# Patient Record
Sex: Male | Born: 1980
Health system: Southern US, Community
[De-identification: ages and names within clinical notes are randomized; demographics above are authoritative.]

---

## 2017-03-10 ENCOUNTER — Encounter (HOSPITAL_COMMUNITY): Payer: Self-pay

## 2017-03-10 ENCOUNTER — Other Ambulatory Visit: Payer: Self-pay

## 2017-03-10 ENCOUNTER — Emergency Department (HOSPITAL_COMMUNITY)
Admission: EM | Admit: 2017-03-10 | Discharge: 2017-03-10 | Disposition: A | Discharge status: Home / Self Care | Payer: No Typology Code available for payment source | Attending: Emergency Medicine | Admitting: Emergency Medicine

## 2017-03-10 ENCOUNTER — Emergency Department (HOSPITAL_COMMUNITY): Payer: No Typology Code available for payment source

## 2017-03-10 DIAGNOSIS — Y999 Unspecified external cause status: Secondary | ICD-10-CM | POA: Insufficient documentation

## 2017-03-10 DIAGNOSIS — Y929 Unspecified place or not applicable: Secondary | ICD-10-CM | POA: Insufficient documentation

## 2017-03-10 DIAGNOSIS — S63601A Unspecified sprain of right thumb, initial encounter: Secondary | ICD-10-CM

## 2017-03-10 DIAGNOSIS — S43401A Unspecified sprain of right shoulder joint, initial encounter: Secondary | ICD-10-CM | POA: Diagnosis not present

## 2017-03-10 DIAGNOSIS — S4991XA Unspecified injury of right shoulder and upper arm, initial encounter: Secondary | ICD-10-CM | POA: Diagnosis present

## 2017-03-10 DIAGNOSIS — Y9389 Activity, other specified: Secondary | ICD-10-CM | POA: Insufficient documentation

## 2017-03-10 MED ORDER — METHOCARBAMOL 500 MG PO TABS: 500.0000 mg | ORAL_TABLET | Freq: Two times a day (BID) | ORAL | 0 refills | Status: DC

## 2017-03-10 MED ORDER — DICLOFENAC SODIUM 75 MG PO TBEC
75.0000 mg | DELAYED_RELEASE_TABLET | Freq: Once | ORAL | Status: AC
  Administered 2017-03-10: 75 mg via ORAL
  Filled 2017-03-10 (×2): qty 1

## 2017-03-10 MED ORDER — DICLOFENAC SODIUM 75 MG PO TBEC: 75.0000 mg | DELAYED_RELEASE_TABLET | Freq: Two times a day (BID) | ORAL | 0 refills | Status: DC
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
## 2017-03-10 NOTE — ED Notes (Signed)
Messaged pharmacy to verify medication. 

## 2017-03-10 NOTE — Discharge Instructions (Signed)
Return if any problems.
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
## 2018-09-30 ENCOUNTER — Other Ambulatory Visit: Payer: Self-pay

## 2018-09-30 ENCOUNTER — Emergency Department (HOSPITAL_BASED_OUTPATIENT_CLINIC_OR_DEPARTMENT_OTHER): Payer: No Typology Code available for payment source

## 2018-09-30 ENCOUNTER — Encounter (HOSPITAL_BASED_OUTPATIENT_CLINIC_OR_DEPARTMENT_OTHER): Payer: Self-pay

## 2018-09-30 ENCOUNTER — Emergency Department (HOSPITAL_BASED_OUTPATIENT_CLINIC_OR_DEPARTMENT_OTHER)
Admission: EM | Admit: 2018-09-30 | Discharge: 2018-09-30 | Disposition: A | Discharge status: Home / Self Care | Payer: No Typology Code available for payment source | Attending: Emergency Medicine | Admitting: Emergency Medicine

## 2018-09-30 DIAGNOSIS — Y939 Activity, unspecified: Secondary | ICD-10-CM | POA: Insufficient documentation

## 2018-09-30 DIAGNOSIS — Y999 Unspecified external cause status: Secondary | ICD-10-CM | POA: Insufficient documentation

## 2018-09-30 DIAGNOSIS — S161XXA Strain of muscle, fascia and tendon at neck level, initial encounter: Secondary | ICD-10-CM | POA: Insufficient documentation

## 2018-09-30 DIAGNOSIS — S199XXA Unspecified injury of neck, initial encounter: Secondary | ICD-10-CM | POA: Diagnosis present

## 2018-09-30 DIAGNOSIS — Z79899 Other long term (current) drug therapy: Secondary | ICD-10-CM | POA: Diagnosis not present

## 2018-09-30 DIAGNOSIS — R51 Headache: Secondary | ICD-10-CM | POA: Diagnosis not present

## 2018-09-30 DIAGNOSIS — F17210 Nicotine dependence, cigarettes, uncomplicated: Secondary | ICD-10-CM | POA: Diagnosis not present

## 2018-09-30 DIAGNOSIS — S39012A Strain of muscle, fascia and tendon of lower back, initial encounter: Secondary | ICD-10-CM | POA: Insufficient documentation

## 2018-09-30 DIAGNOSIS — Y92410 Unspecified street and highway as the place of occurrence of the external cause: Secondary | ICD-10-CM | POA: Diagnosis not present

## 2018-09-30 MED ORDER — CYCLOBENZAPRINE HCL 10 MG PO TABS: 10.0000 mg | ORAL_TABLET | Freq: Two times a day (BID) | ORAL | 0 refills | Status: DC | PRN

## 2018-09-30 MED ORDER — IBUPROFEN 800 MG PO TABS: 800.0000 mg | ORAL_TABLET | Freq: Three times a day (TID) | ORAL | 0 refills | Status: DC | PRN
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
## 2018-09-30 NOTE — ED Notes (Signed)
Patient transported to CT 

## 2020-02-28 ENCOUNTER — Encounter (HOSPITAL_BASED_OUTPATIENT_CLINIC_OR_DEPARTMENT_OTHER): Payer: Self-pay | Admitting: *Deleted

## 2020-02-28 ENCOUNTER — Emergency Department (HOSPITAL_BASED_OUTPATIENT_CLINIC_OR_DEPARTMENT_OTHER)
Admission: EM | Admit: 2020-02-28 | Discharge: 2020-02-28 | Disposition: A | Discharge status: Home / Self Care | Payer: Self-pay | Attending: Emergency Medicine | Admitting: Emergency Medicine

## 2020-02-28 ENCOUNTER — Other Ambulatory Visit: Payer: Self-pay

## 2020-02-28 ENCOUNTER — Other Ambulatory Visit (HOSPITAL_BASED_OUTPATIENT_CLINIC_OR_DEPARTMENT_OTHER): Payer: Self-pay | Admitting: Emergency Medicine

## 2020-02-28 DIAGNOSIS — M5441 Lumbago with sciatica, right side: Secondary | ICD-10-CM

## 2020-02-28 DIAGNOSIS — M545 Low back pain, unspecified: Secondary | ICD-10-CM | POA: Insufficient documentation

## 2020-02-28 DIAGNOSIS — F17200 Nicotine dependence, unspecified, uncomplicated: Secondary | ICD-10-CM | POA: Insufficient documentation

## 2020-02-28 MED ORDER — KETOROLAC TROMETHAMINE 60 MG/2ML IM SOLN
30.0000 mg | Freq: Once | INTRAMUSCULAR | Status: AC
  Administered 2020-02-28: 30 mg via INTRAMUSCULAR
  Filled 2020-02-28: qty 2

## 2020-02-28 MED ORDER — LIDOCAINE 5 % EX PTCH: 1.0000 | MEDICATED_PATCH | CUTANEOUS | 0 refills | Status: DC

## 2020-02-28 MED ORDER — HYDROCODONE-ACETAMINOPHEN 5-325 MG PO TABS: 1.0000 | ORAL_TABLET | Freq: Four times a day (QID) | ORAL | 0 refills | Status: DC | PRN

## 2020-02-28 MED ORDER — NAPROXEN 500 MG PO TABS: 500.0000 mg | ORAL_TABLET | Freq: Two times a day (BID) | ORAL | 0 refills | Status: DC

## 2020-02-28 MED ORDER — METHOCARBAMOL 750 MG PO TABS: 750.0000 mg | ORAL_TABLET | Freq: Two times a day (BID) | ORAL | 0 refills | Status: DC | PRN

## 2020-02-28 MED ORDER — PREDNISONE 10 MG (21) PO TBPK
ORAL_TABLET | ORAL | 0 refills | Status: AC
Start: 2020-02-28 — End: ?

## 2020-02-28 MED FILL — LIDOCAINE PATCH 5%: 5 | 30 days supply | Qty: 30 | Fill #0

## 2020-02-28 MED FILL — HYDROCODON-APAP 5-325: 5-325 | 2 days supply | Qty: 6 | Fill #0

## 2020-02-28 MED FILL — predniSONE 10 MG TABS: 10 | 6 days supply | Qty: 21 | Fill #0

## 2020-02-28 MED FILL — NAPROXEN 500 MG TABS: 500 | 15 days supply | Qty: 30 | Fill #0

## 2020-02-28 MED FILL — METHOCARBAMOL 750 MG TABS: 750 | 10 days supply | Qty: 20 | Fill #0

## 2020-02-28 NOTE — ED Triage Notes (Signed)
Walking down steps and felt pain in his lower back with radiation down his right leg. He is ambulatory.

## 2020-02-28 NOTE — Discharge Instructions (Signed)
  Take it easy, but do not lay around too much as this may make any stiffness worse.  Antiinflammatory medications: Take 600 mg of ibuprofen every 6 hours or 440 mg (over the counter dose) to 500 mg (prescription dose) of naproxen every 12 hours for the next 3 days. After this time, these medications may be used as needed for pain. Take these medications with food to avoid upset stomach. Choose only one of these medications, do not take them together. Acetaminophen (generic for Tylenol): Should you continue to have additional pain while taking the ibuprofen or naproxen, you may add in acetaminophen as needed. Your daily total maximum amount of acetaminophen from all sources should be limited to 4000mg /day for persons without liver problems, or 2000mg /day for those with liver problems. Vicodin: May take Vicodin (hydrocodone-acetaminophen) as needed for severe pain.   Do not drive or perform other dangerous activities while taking this medication as it can cause drowsiness as well as changes in reaction time and judgement.   Please note that each pill of Vicodin contains 325 mg of acetaminophen (generic for Tylenol) and the above dosage limits apply. Methocarbamol: Methocarbamol (generic for Robaxin) is a muscle relaxer and can help relieve stiff muscles or muscle spasms.  Do not drive or perform other dangerous activities while taking this medication as it can cause drowsiness as well as changes in reaction time and judgement. Lidocaine patches: These are available via either prescription or over-the-counter. The over-the-counter option may be more economical one and are likely just as effective. There are multiple over-the-counter brands, such as Salonpas. Ice: May apply ice to the area over the next 24 hours for 15 minutes at a time to reduce pain, inflammation, and swelling, if present. Exercises: Be sure to perform the attached exercises starting with three times a week and working up to performing them  daily. This is an essential part of preventing long term problems.  Follow up: Follow up with the orthopedic specialist for any future management of these complaints. Be sure to follow up within 7-10 days. Return: Return to the ED should symptoms worsen.  For prescription assistance, may try using prescription discount sites or apps, such as goodrx.com

## 2020-02-28 NOTE — ED Provider Notes (Signed)
[facility] EMERGENCY DEPARTMENT Provider Note   CSN: [id number] Arrival date & time: 02/28/20  1403     History Chief Complaint  Patient presents with  . Back Pain    Eduardo Thomas is a 39 y.o. male.  HPI      Eduardo Thomas is a 39 y.o. male, presenting to the ED with acute on chronic lower back pain beginning two days ago.   Pain is located on the right lower back, described as a tightness and soreness, intermittent spasms, moderate to severe, radiating into the buttocks and down the back of the right leg.  Intermittent tingling in the right leg as well.  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  He has been taking ibuprofen on a schedule without improvement.  Denies falls/trauma, fever, abdominal pain, numbness, weakness, changes in bowel or bladder function, saddle anesthesias, urinary symptoms, or any other complaints.     History reviewed. No pertinent past medical history.  There are no problems to display for this patient.   History reviewed. No pertinent surgical history.     No family history on file.  Social History   Tobacco Use  . Smoking status: Current Every Day Smoker  . Smokeless tobacco: Never Used  Vaping Use  . Vaping Use: Never used  Substance Use Topics  . Alcohol use: Never  . Drug use: Yes    Types: Marijuana    Home Medications Prior to Admission medications   Medication Sig Start Date End Date Taking? Authorizing Provider  HYDROcodone-acetaminophen (NORCO/VICODIN) 5-325 MG tablet Take 1-2 tablets by mouth every 6 (six) hours as needed for severe pain. 02/28/20   Nimra Puccinelli C, PA-C  lidocaine (LIDODERM) 5 % Place 1 patch onto the skin daily. Remove & Discard patch within 12 hours or as directed by MD 02/28/20   Saahas Hidrogo, Hillard Danker, PA-C  methocarbamol (ROBAXIN) 750 MG tablet Take 1 tablet (750 mg total) by mouth 2 (two) times daily as needed for muscle spasms  (or muscle tightness). 02/28/20   Rodricus Candelaria C, PA-C  naproxen (NAPROSYN) 500 MG tablet Take 1 tablet (500 mg total) by mouth 2 (two) times daily. 02/28/20   Rayanne Padmanabhan C, PA-C  predniSONE (STERAPRED UNI-PAK 21 TAB) 10 MG (21) TBPK tablet Take 6 tabs (60mg ) day 1, 5 tabs (50mg ) day 2, 4 tabs (40mg ) day 3, 3 tabs (30mg ) day 4, 2 tabs (20mg ) day 5, and 1 tab (10mg ) day 6. 02/28/20   Lela Gell C, PA-C    Allergies    Patient has no known allergies.  Review of Systems   Review of Systems  Constitutional: Negative for chills and fever.  Respiratory: Negative for shortness of breath.   Cardiovascular: Negative for chest pain.  Gastrointestinal: Negative for abdominal pain, nausea and vomiting.  Genitourinary: Negative for difficulty urinating, dysuria, scrotal swelling and testicular pain.  Musculoskeletal: Positive for back pain. Negative for neck pain.  Neurological: Negative for weakness and numbness.  All other systems reviewed and are negative.   Physical Exam Updated Vital Signs BP 113/78   Pulse 71   Temp 98.5 F (36.9 C) (Oral)   Resp 16   Ht 6' (1.829 m)   Wt 88.5 kg   SpO2 100%   BMI 26.45 kg/m   Physical Exam Vitals and nursing note reviewed.  Constitutional:      General: He is not in acute distress.  Appearance: He is well-developed. He is not diaphoretic.  HENT:     Head: Normocephalic and atraumatic.     Mouth/Throat:     Mouth: Mucous membranes are moist.     Pharynx: Oropharynx is clear.  Eyes:     Conjunctiva/sclera: Conjunctivae normal.  Cardiovascular:     Rate and Rhythm: Normal rate and regular rhythm.     Pulses: Normal pulses.          Radial pulses are 2+ on the right side and 2+ on the left side.       Posterior tibial pulses are 2+ on the right side and 2+ on the left side.     Comments: Tactile temperature in the extremities appropriate and equal bilaterally. Pulmonary:     Effort: Pulmonary effort is normal. No respiratory distress.    Abdominal:     Palpations: Abdomen is soft.     Tenderness: There is no abdominal tenderness. There is no guarding.  Musculoskeletal:     Cervical back: Neck supple.     Lumbar back: Tenderness present. Positive right straight leg raise test.       Back:     Right lower leg: No edema.     Left lower leg: No edema.  Skin:    General: Skin is warm and dry.  Neurological:     Mental Status: He is alert.     Deep Tendon Reflexes:     Reflex Scores:      Patellar reflexes are 2+ on the right side and 2+ on the left side.    Comments: Sensation grossly intact to light touch in the lower extremities bilaterally. No saddle anesthesias. Strength 5/5 in the bilateral lower extremities. Slow, antalgic gait.  Ambulates independently without noted instability.  No noted foot drop. Coordination intact.  Psychiatric:        Mood and Affect: Mood and affect normal.        Speech: Speech normal.        Behavior: Behavior normal.     ED Results / Procedures / Treatments   Labs (all labs ordered are listed, but only abnormal results are displayed) Labs Reviewed - No data to display  EKG None  Radiology No results found.  Procedures Procedures (including critical care time)  Medications Ordered in ED Medications  ketorolac (TORADOL) injection 30 mg (30 mg Intramuscular Given 02/28/20 1508)    ED Course  I have reviewed the triage vital signs and the nursing notes.  Pertinent labs & imaging results that were available during my care of the patient were reviewed by me and considered in my medical decision making (see chart for details).    MDM Rules/Calculators/A&P                          Patient presents with right lower back pain with symptoms suggestive of sciatica. No findings which would give concern for neurosurgical emergency, such as cauda equina. The patient was given instructions for home care as well as return precautions. Patient voices understanding of these  instructions, accepts the plan, and is comfortable with discharge.   Final Clinical Impression(s) / ED Diagnoses Final diagnoses:  Acute right-sided low back pain with right-sided sciatica    Rx / DC Orders ED Discharge Orders         Ordered    predniSONE (STERAPRED UNI-PAK 21 TAB) 10 MG (21) TBPK tablet        02/28/20  1501    naproxen (NAPROSYN) 500 MG tablet  2 times daily        02/28/20 1501    methocarbamol (ROBAXIN) 750 MG tablet  2 times daily PRN        02/28/20 1501    HYDROcodone-acetaminophen (NORCO/VICODIN) 5-325 MG tablet  Every 6 hours PRN        02/28/20 1501    lidocaine (LIDODERM) 5 %  Every 24 hours        02/28/20 1501           Anselm Pancoast, PA-C 02/28/20 1513    Benjiman Core, MD 02/29/20 [phone]

## 2020-02-28 NOTE — ED Notes (Signed)
Review D/C papers with pt, reviewed Rx with pt, pt states understanding, pt denies questions at this time. 

## 2020-03-12 ENCOUNTER — Telehealth: Payer: Self-pay | Admitting: Family Medicine

## 2020-03-12 NOTE — Telephone Encounter (Signed)
Called pt to offer ED f/u appt --no answer , message left.  --glh

## 2020-03-15 ENCOUNTER — Emergency Department (HOSPITAL_BASED_OUTPATIENT_CLINIC_OR_DEPARTMENT_OTHER)
Admission: EM | Admit: 2020-03-15 | Discharge: 2020-03-16 | Disposition: A | Discharge status: Home / Self Care | Payer: Self-pay | Attending: Emergency Medicine | Admitting: Emergency Medicine

## 2020-03-15 ENCOUNTER — Emergency Department (HOSPITAL_BASED_OUTPATIENT_CLINIC_OR_DEPARTMENT_OTHER): Payer: Self-pay

## 2020-03-15 ENCOUNTER — Encounter (HOSPITAL_BASED_OUTPATIENT_CLINIC_OR_DEPARTMENT_OTHER): Payer: Self-pay

## 2020-03-15 ENCOUNTER — Other Ambulatory Visit: Payer: Self-pay

## 2020-03-15 DIAGNOSIS — R5383 Other fatigue: Secondary | ICD-10-CM | POA: Insufficient documentation

## 2020-03-15 DIAGNOSIS — R197 Diarrhea, unspecified: Secondary | ICD-10-CM | POA: Insufficient documentation

## 2020-03-15 DIAGNOSIS — R52 Pain, unspecified: Secondary | ICD-10-CM | POA: Insufficient documentation

## 2020-03-15 DIAGNOSIS — F17210 Nicotine dependence, cigarettes, uncomplicated: Secondary | ICD-10-CM | POA: Insufficient documentation

## 2020-03-15 DIAGNOSIS — J189 Pneumonia, unspecified organism: Secondary | ICD-10-CM

## 2020-03-15 DIAGNOSIS — J181 Lobar pneumonia, unspecified organism: Secondary | ICD-10-CM | POA: Insufficient documentation

## 2020-03-15 DIAGNOSIS — Z20822 Contact with and (suspected) exposure to covid-19: Secondary | ICD-10-CM | POA: Insufficient documentation

## 2020-03-15 LAB — RESP PANEL BY RT-PCR (FLU A&B, COVID) ARPGX2
Influenza A by PCR: NEGATIVE
Influenza B by PCR: NEGATIVE
SARS Coronavirus 2 by RT PCR: NEGATIVE

## 2020-03-15 MED ORDER — BENZONATATE 100 MG PO CAPS
100.0000 mg | ORAL_CAPSULE | Freq: Three times a day (TID) | ORAL | 0 refills | Status: AC | PRN
Start: 2020-03-15 — End: ?

## 2020-03-15 MED ORDER — DOXYCYCLINE HYCLATE 100 MG PO CAPS: 100.0000 mg | ORAL_CAPSULE | Freq: Two times a day (BID) | ORAL | 0 refills | Status: AC

## 2020-03-15 NOTE — Discharge Instructions (Addendum)
Like we discussed, I am prescribing you an antibiotic called doxycycline.  He to take this twice a day for the next 7 days for your possible pneumonia.  Do not stop taking this early.  I will also give you a short course of Tessalon Perles for your cough.  You can take these up to 3 times a day to help with your cough.  If you find this medication is not working well you can always try Robitussin or Delsym, which can both be purchased over-the-counter.  If you develop worsening symptoms, you can always return to the ER for reevaluation.  It was a pleasure to meet you.

## 2020-03-15 NOTE — ED Notes (Signed)
Called to waiting room for SOB, sat is 97% at this time

## 2020-03-15 NOTE — ED Provider Notes (Signed)
[facility] EMERGENCY DEPARTMENT Provider Note   CSN: [id number] Arrival date & time: 03/15/20  2040     History Chief Complaint  Patient presents with  . Cough    Eduardo Thomas is a 39 y.o. male.  HPI   Patient is a 39 year old male who presents to the emergency department due to a cough.  States his symptoms started about 6 days ago.  Notes multiple sick contacts with similar symptoms in his household.  States his cough is productive with green phlegm.  Reports associated congestion, fatigue, body aches, diarrhea.  His symptoms lasted about 2 to 3 days and briefly alleviated and then restarted once again yesterday.  He is not vaccinated for COVID-19.  States he had a phone call with a provider and was given Tamiflu which he took without relief.  Denies sore throat, ear pain, chest pain, abdominal pain, vomiting.     History reviewed. No pertinent past medical history.  There are no problems to display for this patient.   History reviewed. No pertinent surgical history.     No family history on file.  Social History   Tobacco Use  . Smoking status: Current Every Day Smoker    Types: Cigarettes  . Smokeless tobacco: Never Used  Vaping Use  . Vaping Use: Never used  Substance Use Topics  . Alcohol use: Never  . Drug use: Yes    Types: Marijuana    Home Medications Prior to Admission medications   Medication Sig Start Date End Date Taking? Authorizing Provider  HYDROcodone-acetaminophen (NORCO/VICODIN) 5-325 MG tablet Take 1-2 tablets by mouth every 6 (six) hours as needed for severe pain. 02/28/20   Joy, Shawn C, PA-C  lidocaine (LIDODERM) 5 % Place 1 patch onto the skin daily. Remove & Discard patch within 12 hours or as directed by MD 02/28/20   Joy, Hillard Danker, PA-C  methocarbamol (ROBAXIN) 750 MG tablet Take 1 tablet (750 mg total) by mouth 2 (two) times daily as needed for muscle spasms (or muscle tightness). 02/28/20   Joy, Shawn C, PA-C  naproxen  (NAPROSYN) 500 MG tablet Take 1 tablet (500 mg total) by mouth 2 (two) times daily. 02/28/20   Joy, Shawn C, PA-C  predniSONE (STERAPRED UNI-PAK 21 TAB) 10 MG (21) TBPK tablet Take 6 tabs (60mg ) day 1, 5 tabs (50mg ) day 2, 4 tabs (40mg ) day 3, 3 tabs (30mg ) day 4, 2 tabs (20mg ) day 5, and 1 tab (10mg ) day 6. 02/28/20   Joy, Shawn C, PA-C    Allergies    Patient has no known allergies.  Review of Systems   Review of Systems  Constitutional: Positive for activity change, chills and fatigue. Negative for fever.  HENT: Positive for congestion, rhinorrhea and sneezing. Negative for ear discharge, ear pain, sore throat, trouble swallowing and voice change.   Respiratory: Positive for cough.   Cardiovascular: Negative for chest pain.  Gastrointestinal: Negative for abdominal pain and vomiting.    Physical Exam Updated Vital Signs BP (!) 143/92 (BP Location: Left Arm)   Pulse 83   Temp 99.1 F (37.3 C) (Oral)   Resp 18   Ht 6' (1.829 m)   Wt 88.5 kg   SpO2 99%   BMI 26.45 kg/m   Physical Exam Vitals and nursing note reviewed.  Constitutional:      General: He is not in acute distress.    Appearance: Normal appearance. He is not ill-appearing, toxic-appearing or diaphoretic.  HENT:  Head: Normocephalic and atraumatic.     Right Ear: External ear normal.     Left Ear: External ear normal.     Nose: Nose normal.     Mouth/Throat:     Mouth: Mucous membranes are moist.     Pharynx: Oropharynx is clear. No oropharyngeal exudate or posterior oropharyngeal erythema.     Comments: No significant erythema noted in the posterior oropharynx.  Uvula midline.  Readily handling secretions. Eyes:     Extraocular Movements: Extraocular movements intact.  Cardiovascular:     Rate and Rhythm: Normal rate and regular rhythm.     Pulses: Normal pulses.     Heart sounds: Normal heart sounds. No murmur heard.  No friction rub. No gallop.      Comments: Regular rate and rhythm without murmurs,  rubs, gallops. Pulmonary:     Effort: Pulmonary effort is normal. No respiratory distress.     Breath sounds: Normal breath sounds. No stridor. No wheezing, rhonchi or rales.     Comments: Lungs are clear to auscultation bilaterally.  No wheezing, rales, rhonchi. Abdominal:     General: Abdomen is flat.     Tenderness: There is no abdominal tenderness.  Musculoskeletal:        General: Normal range of motion.     Cervical back: Normal range of motion and neck supple. No tenderness.  Skin:    General: Skin is warm and dry.  Neurological:     General: No focal deficit present.     Mental Status: He is alert and oriented to person, place, and time.  Psychiatric:        Mood and Affect: Mood normal.        Behavior: Behavior normal.    ED Results / Procedures / Treatments   Labs (all labs ordered are listed, but only abnormal results are displayed) Labs Reviewed  RESP PANEL BY RT-PCR (FLU A&B, COVID) [id number]   EKG None  Radiology DG Chest Portable 1 View  Result Date: 03/15/2020 CLINICAL DATA:  Cough, flu-like symptoms for 6 days took Tamiflu for 2 days prior to stopping EXAM: PORTABLE CHEST 1 VIEW COMPARISON:  None. FINDINGS: Streaky opacities are present in the left lung base. Lungs are otherwise clear. No convincing features of edema, pneumothorax, or effusion. Pulmonary vascularity is normally distributed. The cardiomediastinal contours are unremarkable. No acute osseous or soft tissue abnormality. IMPRESSION: Streaky opacities in the left lung base could reflect atelectasis versus early infection. Electronically Signed   By: Kreg Shropshire M.D.   On: 03/15/2020 23:10    Procedures Procedures (including critical care time)  Medications Ordered in ED Medications - No data to display  ED Course  I have reviewed the triage vital signs and the nursing notes.  Pertinent labs & imaging results that were available during my care of the patient were reviewed by me and considered in  my medical decision making (see chart for details).  Clinical Course as of Mar 15 2341  Thu Mar 15, 2020  2330 IMPRESSION: Streaky opacities in the left lung base could reflect atelectasis versus early infection.  DG Chest Portable 1 View [LJ]    Clinical Course User Index [LJ] Placido Sou, PA-C   MDM Rules/Calculators/A&P                          Patient is a 39 year old male who presents to the emergency department with a multitude of symptoms consistent with viral URI.  His symptoms lasted about 2 to 3 days, resolved, then returned yesterday.  His lungs are clear to auscultation bilaterally on my exam.  Temperature mildly elevated at 99.1 Fahrenheit.  No hypoxia.  Given the nature of his symptoms and them resolving prior to returning, was concerned for pneumonia.  I obtained a chest x-ray showing streaky opacities in the left lung base which could reflect an early infection.  We will start the patient on doxycycline for likely community acquired pneumonia.  We will also give a prescription for Tessalon Perles for his cough.  Continued use of Tylenol and ibuprofen for fevers and body aches.  The above plan was discussed with the patient and he was both agreeable and verbalized understanding.  He knows to return to the ER if symptoms worsen.  His questions were answered and he was amicable at the time of discharge.  Final Clinical Impression(s) / ED Diagnoses Final diagnoses:  Community acquired pneumonia of left lower lobe of lung    Rx / DC Orders ED Discharge Orders         Ordered    doxycycline (VIBRAMYCIN) 100 MG capsule  2 times daily        03/15/20 2337    benzonatate (TESSALON) 100 MG capsule  3 times daily PRN        03/15/20 2337           Placido Sou, PA-C 03/15/20 2342    Sabino Donovan, MD 03/24/20 1454

## 2020-03-15 NOTE — ED Triage Notes (Signed)
Pt c/o flu like sx x 6 days-states she used "call a doc" where he got rx tamiflu-took x 2 days then stopped-NAD-steady gait

## 2020-03-16 MED ORDER — ACETAMINOPHEN 325 MG PO TABS
650.0000 mg | ORAL_TABLET | Freq: Once | ORAL | Status: AC
  Administered 2020-03-16: 650 mg via ORAL
  Filled 2020-03-16: qty 2

## 2020-07-13 DEATH — deceased

## 2021-05-13 IMAGING — DX DG CHEST 1V PORT
1 series · 1 of 1 positions shown · non-contrast
Comparison: None.

CLINICAL DATA: Cough, flu-like symptoms for 6 days took Tamiflu for
2 days prior to stopping

EXAM:
PORTABLE CHEST 1 VIEW

[chest ap]
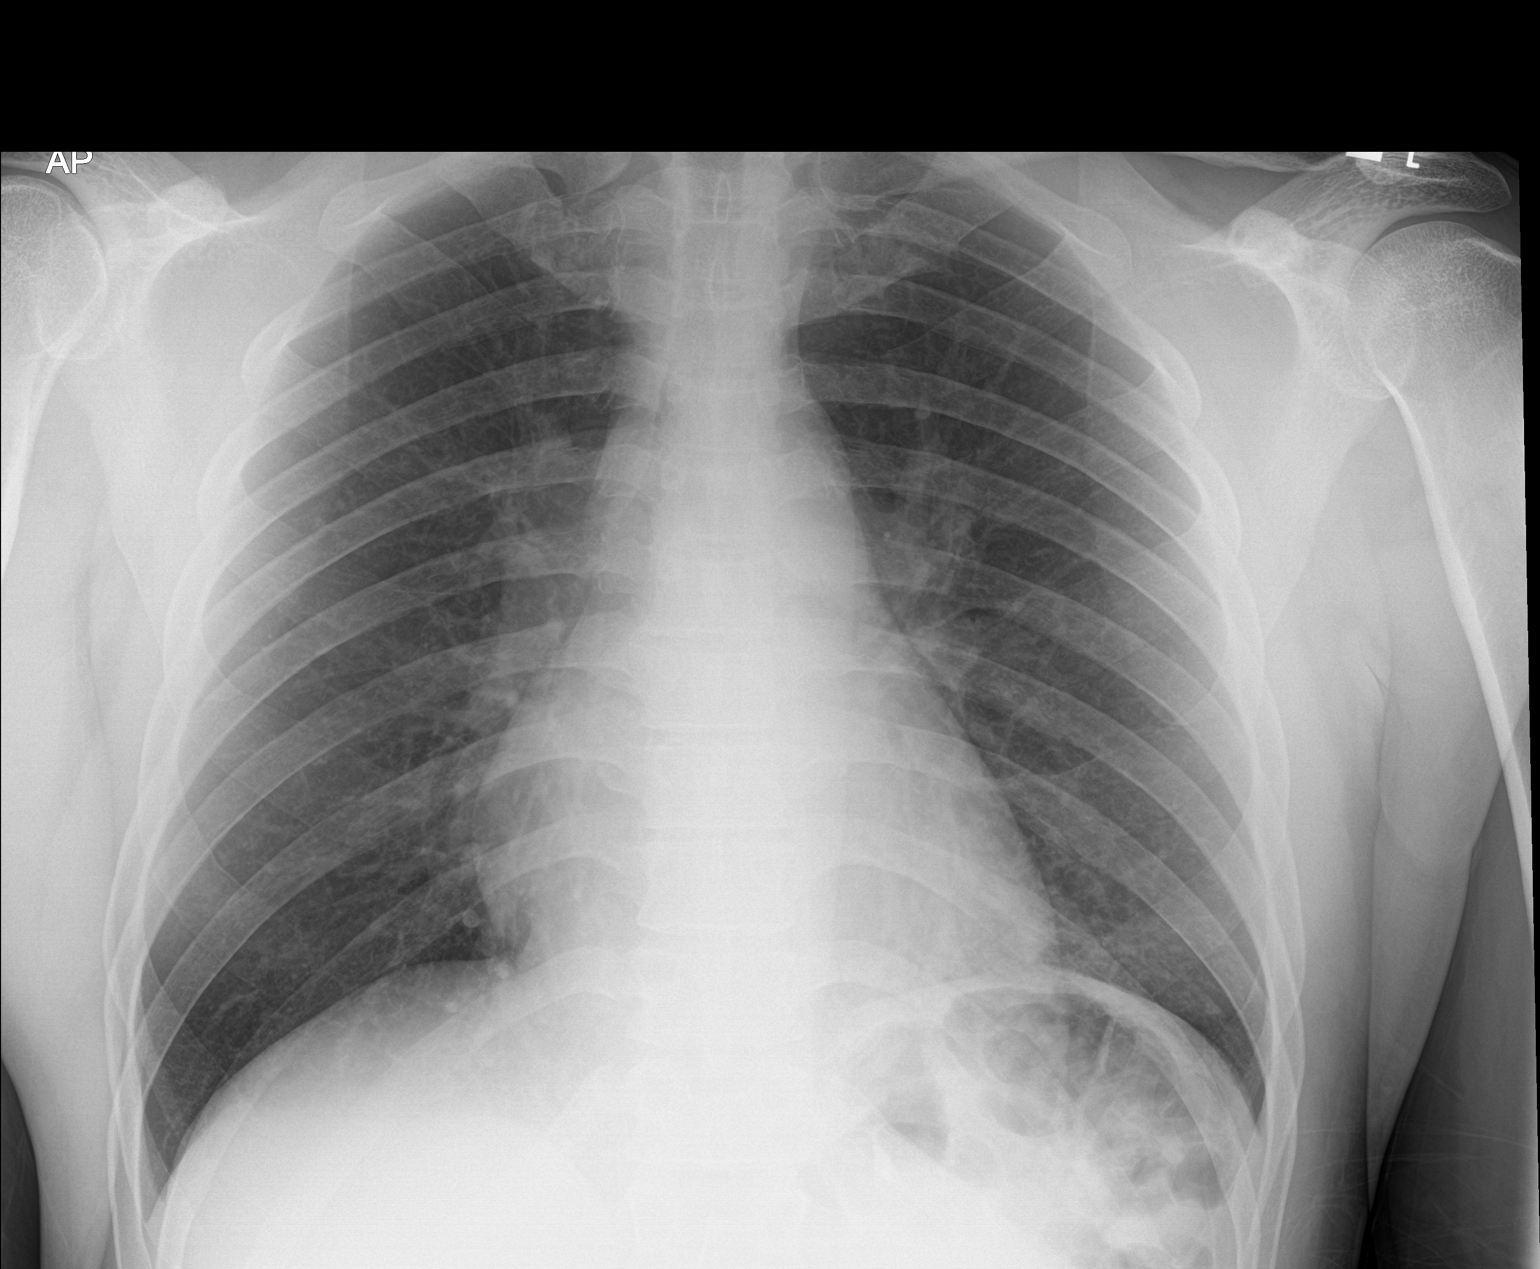

[1 of 1 positions shown; findings below may reference images not displayed]

FINDINGS: Streaky opacities are present in the left lung base. Lungs are
otherwise clear. No convincing features of edema, pneumothorax, or
effusion. Pulmonary vascularity is normally distributed. The
cardiomediastinal contours are unremarkable. No acute osseous or
soft tissue abnormality.
IMPRESSION: Streaky opacities in the left lung base could reflect atelectasis
versus early infection.

## 2022-07-31 ENCOUNTER — Encounter: Payer: Self-pay | Admitting: *Deleted
